# Patient Record
Sex: Female | Born: 2000 | Hispanic: No | Marital: Single | Smoking: Never smoker
Health system: Southern US, Community
[De-identification: ages and names within clinical notes are randomized; demographics above are authoritative.]

## PROBLEM LIST (undated history)

## (undated) DIAGNOSIS — Z8619 Personal history of other infectious and parasitic diseases: Secondary | ICD-10-CM

## (undated) HISTORY — PX: KNEE SURGERY: SHX244

## (undated) HISTORY — DX: Personal history of other infectious and parasitic diseases: Z86.19

## (undated) HISTORY — PX: WISDOM TOOTH EXTRACTION: SHX21

---
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
## 2018-03-24 ENCOUNTER — Ambulatory Visit: Payer: Self-pay | Admitting: *Deleted

## 2018-03-24 NOTE — Telephone Encounter (Signed)
Patient is calling to report that she has been having abdominal pain and diarrhea. Patient triaged and unable to find open appointment within 4 hour window- patient advised to go to [facility]- she agrees to go.   Reason for Disposition . [1] MILD-MODERATE pain AND [2] constant AND [3] present > 2 hours  Answer Assessment - Initial Assessment Questions 1. LOCATION: "Where does it hurt?"      LRQ- with bloating 2. RADIATION: "Does the pain shoot anywhere else?" (e.g., chest, back)     no 3. ONSET: "When did the pain begin?" (e.g., minutes, hours or days ago)      1 week 4. SUDDEN: "Gradual or sudden onset?"     Sudden onset 5. PATTERN "Does the pain come and go, or is it constant?"    - If constant: "Is it getting better, staying the same, or worsening?"      (Note: Constant means the pain never goes away completely; most serious pain is constant and it progresses)     - If intermittent: "How long does it last?" "Do you have pain now?"     (Note: Intermittent means the pain goes away completely between bouts)     Constant- gets worse at times- sharper 6. SEVERITY: "How bad is the pain?"  (e.g., Scale 1-10; mild, moderate, or severe)   - MILD (1-3): doesn't interfere with normal activities, abdomen soft and not tender to touch    - MODERATE (4-7): interferes with normal activities or awakens from sleep, tender to touch    - SEVERE (8-10): excruciating pain, doubled over, unable to do any normal activities      5- moderate 7. RECURRENT SYMPTOM: "Have you ever had this type of abdominal pain before?" If so, ask: "When was the last time?" and "What happened that time?"      no 8. CAUSE: "What do you think is causing the abdominal pain?"     Unknown- thought cycle related- cycle is late- not SA 9. RELIEVING/AGGRAVATING FACTORS: "What makes it better or worse?" (e.g., movement, antacids, bowel movement)     Not really- BM- does not change anything 10. OTHER SYMPTOMS: "Has there been any vomiting,  diarrhea, constipation, or urine problems?"       Diarrhea, bloating 11. PREGNANCY: "Is there any chance you are pregnant?" "When was your last menstrual period?"       No- not SA  Answer Assessment - Initial Assessment Questions 1. DIARRHEA SEVERITY: "How bad is the diarrhea?" "How many extra stools have you had in the past 24 hours than normal?"    - NO DIARRHEA (SCALE 0)   - MILD (SCALE 1-3): Few loose or mushy BMs; increase of 1-3 stools over normal daily number of stools; mild increase in ostomy output.   -  MODERATE (SCALE 4-7): Increase of 4-6 stools daily over normal; moderate increase in ostomy output. * SEVERE (SCALE 8-10; OR 'WORST POSSIBLE'): Increase of 7 or more stools daily over normal; moderate increase in ostomy output; incontinence.     Moderate 2. ONSET: "When did the diarrhea begin?"      Got bad last night- last few days it has been present but not as bad 3. BM CONSISTENCY: "How loose or watery is the diarrhea?"      loose 4. VOMITING: "Are you also vomiting?" If so, ask: "How many times in the past 24 hours?"      no 5. ABDOMINAL PAIN: "Are you having any abdominal pain?" If yes: "What does  it feel like?" (e.g., crampy, dull, intermittent, constant)      See previous note 6. ABDOMINAL PAIN SEVERITY: If present, ask: "How bad is the pain?"  (e.g., Scale 1-10; mild, moderate, or severe)   - MILD (1-3): doesn't interfere with normal activities, abdomen soft and not tender to touch    - MODERATE (4-7): interferes with normal activities or awakens from sleep, tender to touch    - SEVERE (8-10): excruciating pain, doubled over, unable to do any normal activities       See previous note 7. ORAL INTAKE: If vomiting, "Have you been able to drink liquids?" "How much fluids have you had in the past 24 hours?"     Able to fliuds 8. HYDRATION: "Any signs of dehydration?" (e.g., dry mouth [not just dry lips], too weak to stand, dizziness, new weight loss) "When did you last  urinate?"     No signs of dehydration 9. EXPOSURE: "Have you traveled to a foreign country recently?" "Have you been exposed to anyone with diarrhea?" "Could you have eaten any food that was spoiled?"     no 10. ANTIBIOTIC USE: "Are you taking antibiotics now or have you taken antibiotics in the past 2 months?"       no 11. OTHER SYMPTOMS: "Do you have any other symptoms?" (e.g., fever, blood in stool)       no 12. PREGNANCY: "Is there any chance you are pregnant?" "When was your last menstrual period?"       No- not SA  Protocols used: ABDOMINAL PAIN - FEMALE-A-AH, DIARRHEA-A-AH

## 2018-03-24 NOTE — Telephone Encounter (Signed)
See note

## 2018-03-28 NOTE — Telephone Encounter (Signed)
Noted.  Patient advised to go to urgent care.

## 2018-07-04 ENCOUNTER — Telehealth: Payer: Self-pay | Admitting: Family Medicine

## 2018-07-04 NOTE — Telephone Encounter (Signed)
Copied from CRM [id number]. Topic: General - Other >> Jul 04, 2018  3:48 PM Tamela Oddi wrote: Reason for CRM: Patient called to ask the nurse if the doctor would test for celiac disease.  Please call patient back to let her know and answer a few questions.  CB# [phone] >> Jul 04, 2018  3:53 PM Cox, Armando Gang, CMA wrote: Routed to wrong practice. Sending to [facility].

## 2018-07-05 NOTE — Telephone Encounter (Signed)
Called patient's cell and there was n/a.  Unable to leave v/m message due to mailbox being full.  Will try again later.

## 2018-07-05 NOTE — Telephone Encounter (Signed)
Spoke to patient and advised that we would need to see her for a doxy.me visit to discuss further.  Pt agreeable but wants to contact her insurance co first to make sure this is a covered benefit.  She will contact me back with this info and we can schedule appt at that time.

## 2018-07-05 NOTE — Telephone Encounter (Signed)
Let's set up doxy so we can see why she wants to be tested and then I can order what I need to.

## 2018-08-02 ENCOUNTER — Telehealth: Payer: Self-pay

## 2018-08-02 NOTE — Telephone Encounter (Signed)
Spoke to patient.  She states that her insurance changed and that she has recently decided to change her PCP.  She has been tested and was found to be negative.  She is now going to a different PCP.

## 2019-03-26 ENCOUNTER — Ambulatory Visit: Payer: BLUE CROSS/BLUE SHIELD | Attending: Internal Medicine

## 2020-03-20 ENCOUNTER — Encounter: Payer: BLUE CROSS/BLUE SHIELD | Admitting: Family Medicine

## 2021-10-26 ENCOUNTER — Encounter: Payer: Self-pay | Admitting: Nurse Practitioner

## 2023-04-20 ENCOUNTER — Other Ambulatory Visit: Payer: Self-pay | Admitting: Otolaryngology

## 2023-04-22 NOTE — Progress Notes (Signed)
Surgical Instructions   Your procedure is scheduled on April 27, 2023. Report to [facility] Main Entrance "A" at 1:00 P.M., then check in with the Admitting office. Any questions or running late day of surgery: call [phone]  Questions prior to your surgery date: call [phone], Monday-Friday, 8am-4pm. If you experience any cold or flu symptoms such as cough, fever, chills, shortness of breath, etc. between now and your scheduled surgery, please notify us at the above number.     Remember:  Do not eat after midnight the night before your surgery   You may drink clear liquids until 11:59 the morning of your surgery.   Clear liquids allowed are: Water, Non-Citrus Juices (without pulp), Carbonated Beverages, Clear Tea (no milk, honey, etc.), Black Coffee Only (NO MILK, CREAM OR POWDERED CREAMER of any kind), and Gatorade.    May take these medicines IF NEEDED: HYDROcodone-acetaminophen (NORCO/VICODIN)   Follow your physician's instructions regarding your phentermine.  If you have not received instructions, contact your physician's office for them.   One week prior to surgery, STOP taking any Aspirin (unless otherwise instructed by your surgeon) Aleve, Naproxen, Ibuprofen, Motrin, Advil, Goody's, BC's, all herbal medications, fish oil, and non-prescription vitamins.                     Do NOT Smoke (Tobacco/Vaping) for 24 hours prior to your procedure.  If you use a CPAP at night, you may bring your mask/headgear for your overnight stay.   You will be asked to remove any contacts, glasses, piercing's, hearing aid's, dentures/partials prior to surgery. Please bring cases for these items if needed.    Patients discharged the day of surgery will not be allowed to drive home, and someone needs to stay with them for 24 hours.  SURGICAL WAITING ROOM VISITATION Patients may have no more than 2 support people in the waiting area - these visitors may rotate.   Pre-op nurse will  coordinate an appropriate time for 1 ADULT support person, who may not rotate, to accompany patient in pre-op.  Children under the age of 50 must have an adult with them who is not the patient and must remain in the main waiting area with an adult.  If the patient needs to stay at the hospital during part of their recovery, the visitor guidelines for inpatient rooms apply.  Please refer to the [facility] website for the visitor guidelines for any additional information.   If you received a COVID test during your pre-op visit  it is requested that you wear a mask when out in public, stay away from anyone that may not be feeling well and notify your surgeon if you develop symptoms. If you have been in contact with anyone that has tested positive in the last 10 days please notify you surgeon.      Pre-operative CHG Bathing Instructions   You can play a key role in reducing the risk of infection after surgery. Your skin needs to be as free of germs as possible. You can reduce the number of germs on your skin by washing with CHG (chlorhexidine gluconate) soap before surgery. CHG is an antiseptic soap that kills germs and continues to kill germs even after washing.   DO NOT use if you have an allergy to chlorhexidine/CHG or antibacterial soaps. If your skin becomes reddened or irritated, stop using the CHG and notify one of our RNs at [phone].  TAKE A SHOWER THE NIGHT BEFORE SURGERY AND THE DAY OF SURGERY    Please keep in mind the following:  DO NOT shave, including legs and underarms, 48 hours prior to surgery.   You may shave your face before/day of surgery.  Place clean sheets on your bed the night before surgery Use a clean washcloth (not used since being washed) for each shower. DO NOT sleep with pet's night before surgery.  CHG Shower Instructions:  Wash your face and private area with normal soap. If you choose to wash your hair, wash first with your normal shampoo.   After you use shampoo/soap, rinse your hair and body thoroughly to remove shampoo/soap residue.  Turn the water OFF and apply half the bottle of CHG soap to a CLEAN washcloth.  Apply CHG soap ONLY FROM YOUR NECK DOWN TO YOUR TOES (washing for 3-5 minutes)  DO NOT use CHG soap on face, private areas, open wounds, or sores.  Pay special attention to the area where your surgery is being performed.  If you are having back surgery, having someone wash your back for you may be helpful. Wait 2 minutes after CHG soap is applied, then you may rinse off the CHG soap.  Pat dry with a clean towel  Put on clean pajamas    Additional instructions for the day of surgery: DO NOT APPLY any lotions, deodorants, or perfumes.   Do not wear jewelry or makeup Do not wear nail polish, gel polish, artificial nails, or any other type of covering on natural nails (fingers and toes) Do not bring valuables to the hospital. [facility] is not responsible for valuables/personal belongings. Put on clean/comfortable clothes.  Please brush your teeth.  Ask your nurse before applying any prescription medications to the skin.

## 2023-04-25 ENCOUNTER — Inpatient Hospital Stay (HOSPITAL_COMMUNITY)
Admission: RE | Admit: 2023-04-25 | Discharge: 2023-04-25 | Disposition: A | Discharge status: Home / Self Care | Payer: Self-pay | Source: Ambulatory Visit

## 2023-04-25 NOTE — Progress Notes (Signed)
RN contacted the patient in regards to the PAT appointment and patient stated she had to "cancel her surgery". Patient states she has spoke with Dr. Gus Height office already.

## 2023-04-27 ENCOUNTER — Encounter (HOSPITAL_COMMUNITY): Admission: RE | Payer: Self-pay | Source: Ambulatory Visit

## 2023-04-27 ENCOUNTER — Ambulatory Visit (HOSPITAL_COMMUNITY): Admission: RE | Admit: 2023-04-27 | Payer: 59 | Source: Ambulatory Visit | Admitting: Otolaryngology

## 2023-04-27 SURGERY — SEPTOPLASTY, NOSE
Anesthesia: General | Laterality: Bilateral
